# Patient Record
Sex: Female | Born: 1940 | Race: White | Hispanic: No | Marital: Married | Smoking: Never smoker
Health system: Southern US, Community
[De-identification: ages and names within clinical notes are randomized; demographics above are authoritative.]

## PROBLEM LIST (undated history)

## (undated) DIAGNOSIS — M8580 Other specified disorders of bone density and structure, unspecified site: Secondary | ICD-10-CM

## (undated) DIAGNOSIS — I10 Essential (primary) hypertension: Secondary | ICD-10-CM

## (undated) DIAGNOSIS — E785 Hyperlipidemia, unspecified: Secondary | ICD-10-CM

## (undated) HISTORY — PX: COLONOSCOPY: SHX174

---

## 2004-09-20 ENCOUNTER — Ambulatory Visit: Payer: Self-pay | Admitting: Family Medicine

## 2004-09-22 ENCOUNTER — Ambulatory Visit: Payer: Self-pay | Admitting: Family Medicine

## 2006-01-20 ENCOUNTER — Ambulatory Visit: Payer: Self-pay | Admitting: Family Medicine

## 2006-01-24 ENCOUNTER — Ambulatory Visit: Payer: Self-pay | Admitting: Family Medicine

## 2007-01-23 ENCOUNTER — Ambulatory Visit: Payer: Self-pay | Admitting: Family Medicine

## 2008-03-27 ENCOUNTER — Ambulatory Visit: Payer: Self-pay | Admitting: Family Medicine

## 2009-06-16 ENCOUNTER — Ambulatory Visit: Payer: Self-pay | Admitting: Family Medicine

## 2009-08-25 ENCOUNTER — Ambulatory Visit: Payer: Self-pay | Admitting: Gastroenterology

## 2010-06-17 ENCOUNTER — Ambulatory Visit: Payer: Self-pay | Admitting: Family Medicine

## 2011-12-01 ENCOUNTER — Ambulatory Visit: Payer: Self-pay | Admitting: Family Medicine

## 2013-07-01 ENCOUNTER — Ambulatory Visit: Payer: Self-pay | Admitting: Family Medicine

## 2014-09-11 ENCOUNTER — Ambulatory Visit: Payer: Self-pay | Admitting: Family Medicine

## 2015-09-15 ENCOUNTER — Other Ambulatory Visit: Payer: Self-pay | Admitting: Family Medicine

## 2015-09-15 DIAGNOSIS — Z1231 Encounter for screening mammogram for malignant neoplasm of breast: Secondary | ICD-10-CM

## 2015-09-29 ENCOUNTER — Ambulatory Visit
Admission: RE | Admit: 2015-09-29 | Discharge: 2015-09-29 | Disposition: A | Discharge status: Home / Self Care | Payer: Medicare Other | Source: Ambulatory Visit | Attending: Family Medicine | Admitting: Family Medicine

## 2015-09-29 ENCOUNTER — Other Ambulatory Visit: Payer: Self-pay | Admitting: Family Medicine

## 2015-09-29 DIAGNOSIS — Z1231 Encounter for screening mammogram for malignant neoplasm of breast: Secondary | ICD-10-CM

## 2016-09-26 ENCOUNTER — Other Ambulatory Visit: Payer: Self-pay | Admitting: Family Medicine

## 2016-09-26 DIAGNOSIS — Z1231 Encounter for screening mammogram for malignant neoplasm of breast: Secondary | ICD-10-CM

## 2016-10-19 ENCOUNTER — Ambulatory Visit
Admission: RE | Admit: 2016-10-19 | Discharge: 2016-10-19 | Disposition: A | Discharge status: Home / Self Care | Payer: Medicare Other | Source: Ambulatory Visit | Attending: Family Medicine | Admitting: Family Medicine

## 2016-10-19 DIAGNOSIS — Z1231 Encounter for screening mammogram for malignant neoplasm of breast: Secondary | ICD-10-CM

## 2019-01-14 ENCOUNTER — Other Ambulatory Visit: Payer: Self-pay | Admitting: Family Medicine

## 2019-01-14 DIAGNOSIS — Z1231 Encounter for screening mammogram for malignant neoplasm of breast: Secondary | ICD-10-CM

## 2019-02-21 ENCOUNTER — Other Ambulatory Visit: Payer: Self-pay

## 2019-02-21 ENCOUNTER — Ambulatory Visit
Admission: RE | Admit: 2019-02-21 | Discharge: 2019-02-21 | Disposition: A | Discharge status: Home / Self Care | Payer: Medicare Other | Source: Ambulatory Visit | Attending: Family Medicine | Admitting: Family Medicine

## 2019-02-21 DIAGNOSIS — Z1231 Encounter for screening mammogram for malignant neoplasm of breast: Secondary | ICD-10-CM

## 2020-09-22 IMAGING — MG DIGITAL SCREENING BILATERAL MAMMOGRAM WITH TOMO AND CAD
8 series · 8 of 24 positions shown · non-contrast
Comparison: Previous exam(s).

CLINICAL DATA: Screening.

EXAM:
DIGITAL SCREENING BILATERAL MAMMOGRAM WITH TOMO AND CAD

[L CC synth-2D]
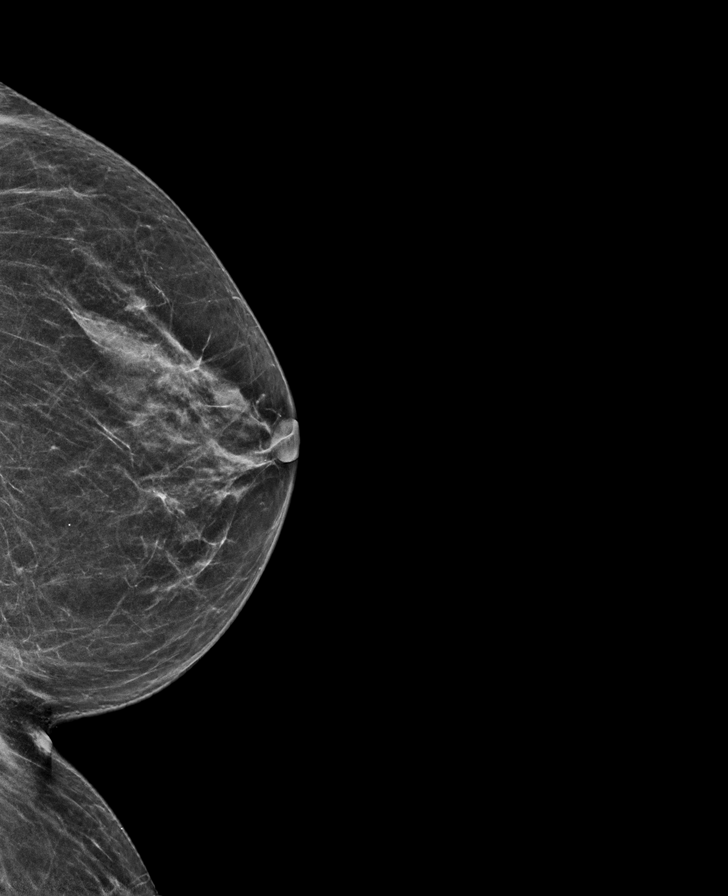

[R MLO synth-2D]
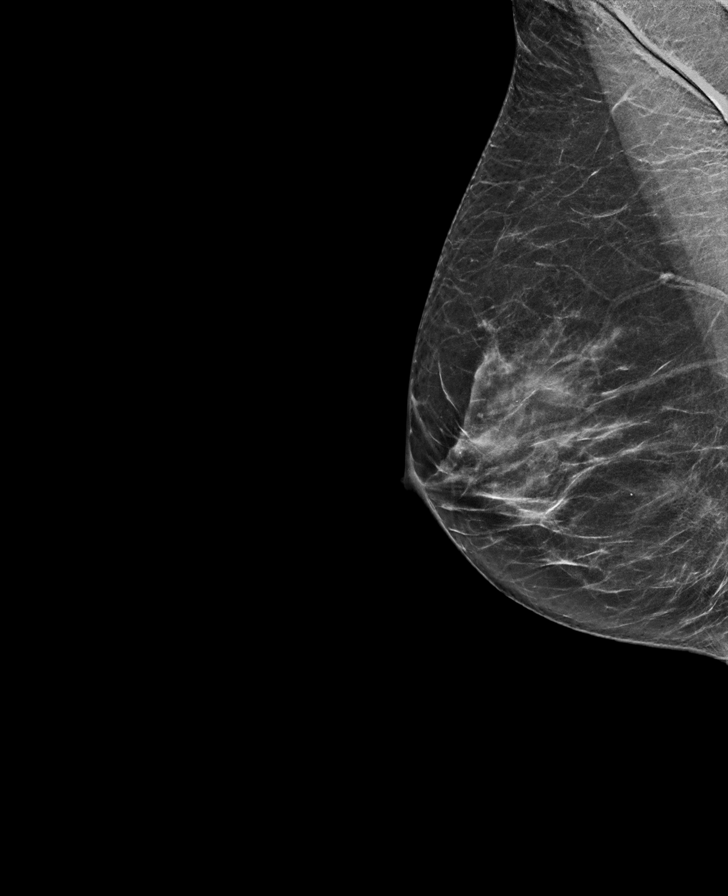

[L MLO synth-2D]
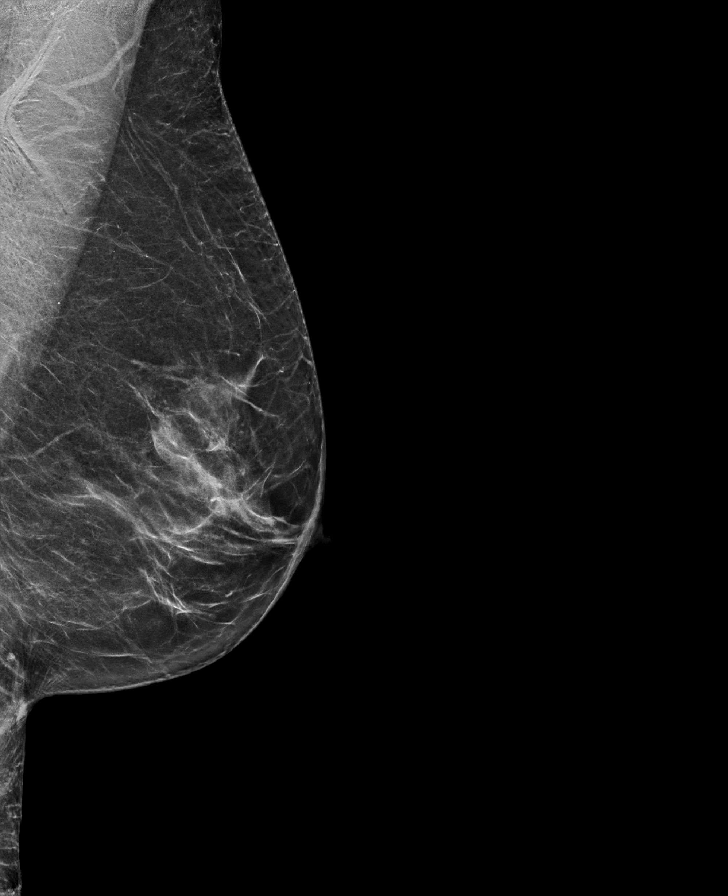

[R CC synth-2D]
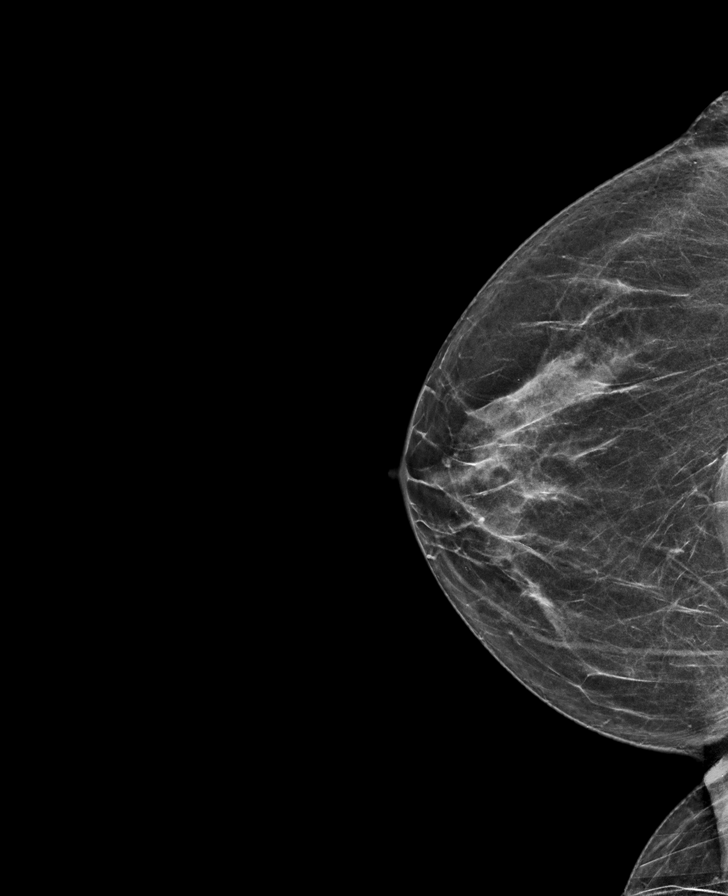

[L CC tomo · tomo slice 28/55.0]
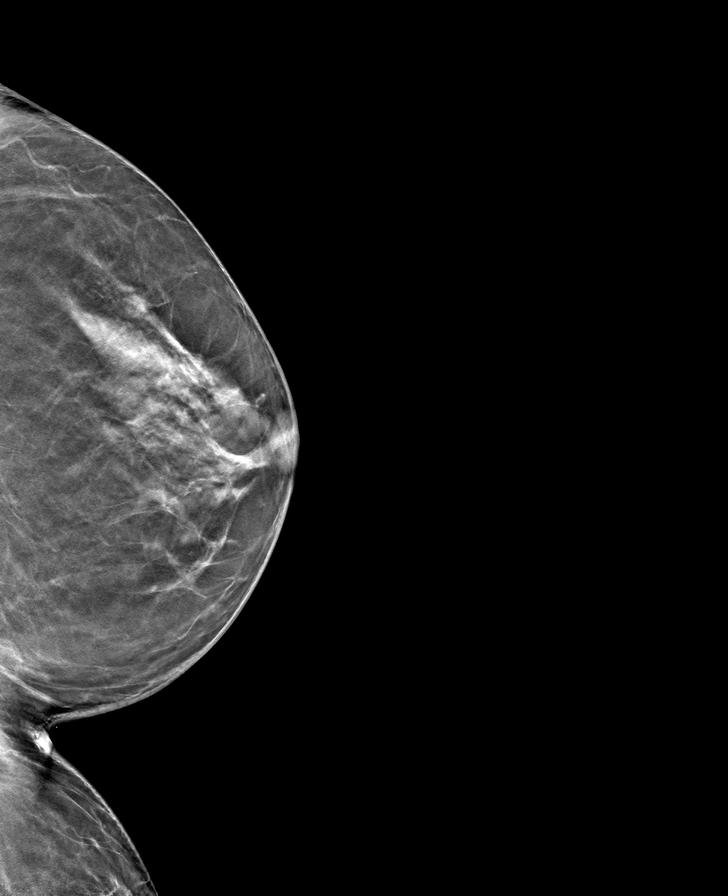

[R MLO tomo · tomo slice 32/63.0]
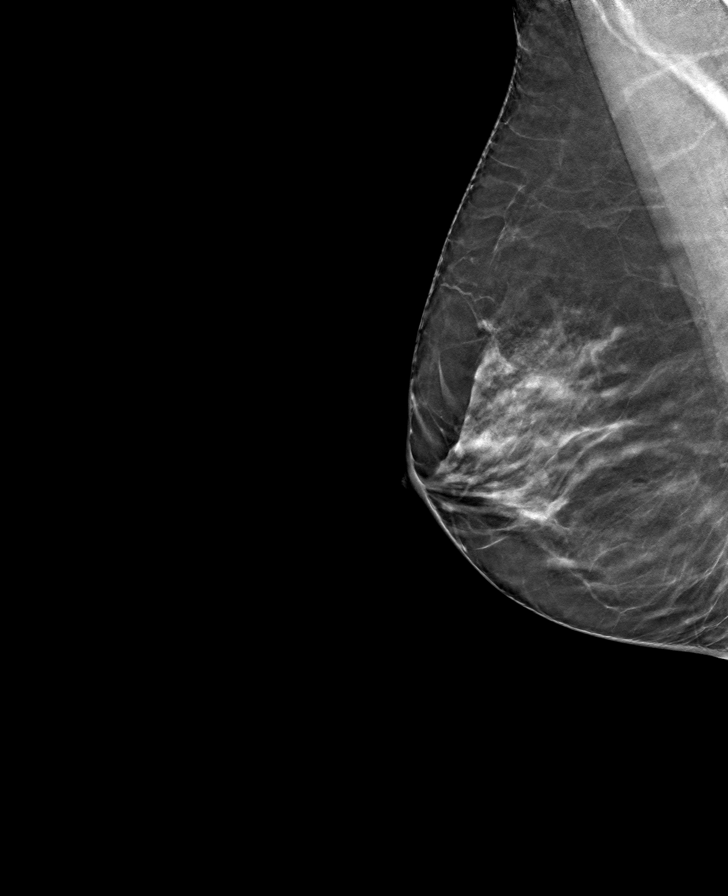

[R CC tomo · tomo slice 31/60.0]
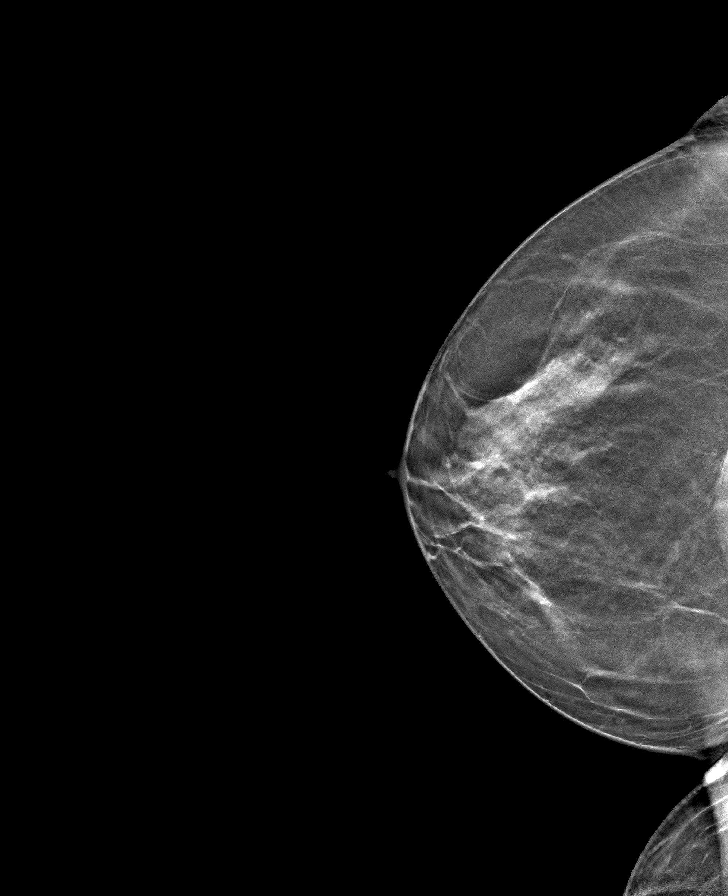

[L MLO tomo · tomo slice 33/64.0]
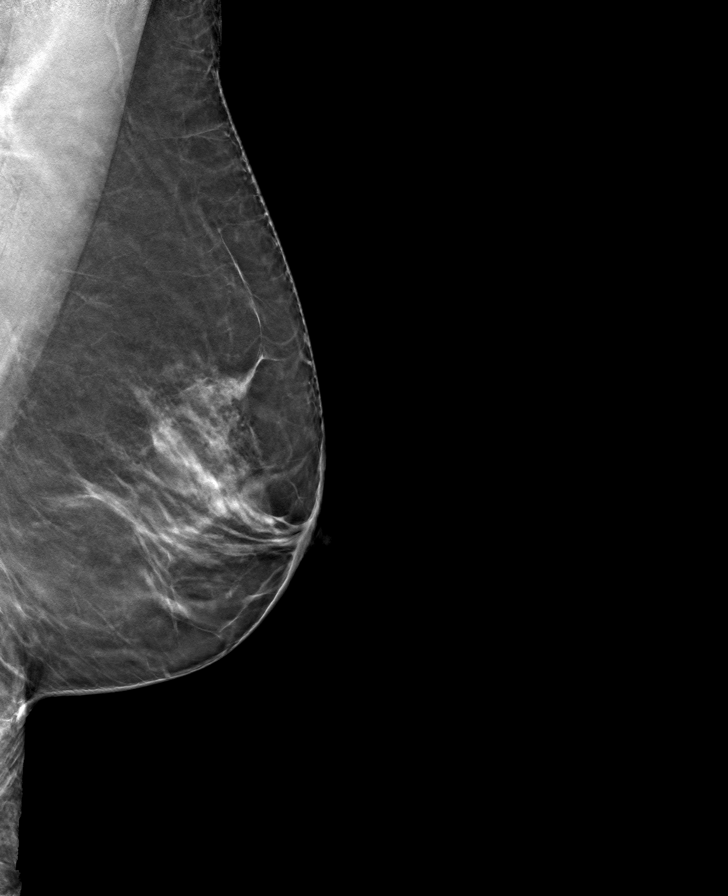

[8 of 24 positions shown; findings below may reference images not displayed]

ACR Breast Density Category c: The breast tissue is heterogeneously
dense, which may obscure small masses.
FINDINGS: There are no findings suspicious for malignancy. Images were
processed with CAD.
IMPRESSION: No mammographic evidence of malignancy. A result letter of this
screening mammogram will be mailed directly to the patient.

RECOMMENDATION:
Screening mammogram in one year. (Code:[id number])

BI-RADS CATEGORY  1: Negative.

## 2020-09-30 ENCOUNTER — Other Ambulatory Visit: Payer: Self-pay | Admitting: Family Medicine

## 2020-09-30 DIAGNOSIS — Z1231 Encounter for screening mammogram for malignant neoplasm of breast: Secondary | ICD-10-CM

## 2020-10-08 LAB — EXTERNAL GENERIC LAB PROCEDURE: COLOGUARD: POSITIVE — AB

## 2020-11-17 ENCOUNTER — Ambulatory Visit
Admission: RE | Admit: 2020-11-17 | Discharge: 2020-11-17 | Disposition: A | Discharge status: Home / Self Care | Payer: Medicare PPO | Source: Ambulatory Visit | Attending: Family Medicine | Admitting: Family Medicine

## 2020-11-17 ENCOUNTER — Other Ambulatory Visit: Payer: Self-pay

## 2020-11-17 DIAGNOSIS — Z1231 Encounter for screening mammogram for malignant neoplasm of breast: Secondary | ICD-10-CM | POA: Diagnosis present

## 2021-03-02 ENCOUNTER — Encounter: Payer: Self-pay | Admitting: General Surgery

## 2021-03-03 ENCOUNTER — Ambulatory Visit: Payer: Medicare PPO | Admitting: Anesthesiology

## 2021-03-03 ENCOUNTER — Other Ambulatory Visit: Payer: Self-pay

## 2021-03-03 ENCOUNTER — Ambulatory Visit
Admission: RE | Admit: 2021-03-03 | Discharge: 2021-03-03 | Disposition: A | Discharge status: Home / Self Care | Payer: Medicare PPO | Attending: General Surgery | Admitting: General Surgery

## 2021-03-03 ENCOUNTER — Encounter: Payer: Self-pay | Admitting: General Surgery

## 2021-03-03 ENCOUNTER — Encounter
Admission: RE | Disposition: A | Discharge status: Home / Self Care | Payer: Self-pay | Source: Home / Self Care | Attending: General Surgery

## 2021-03-03 DIAGNOSIS — Z79899 Other long term (current) drug therapy: Secondary | ICD-10-CM | POA: Diagnosis not present

## 2021-03-03 DIAGNOSIS — R195 Other fecal abnormalities: Secondary | ICD-10-CM | POA: Insufficient documentation

## 2021-03-03 DIAGNOSIS — Z882 Allergy status to sulfonamides status: Secondary | ICD-10-CM | POA: Insufficient documentation

## 2021-03-03 DIAGNOSIS — K5730 Diverticulosis of large intestine without perforation or abscess without bleeding: Secondary | ICD-10-CM | POA: Diagnosis not present

## 2021-03-03 HISTORY — DX: Other specified disorders of bone density and structure, unspecified site: M85.80

## 2021-03-03 HISTORY — PX: COLONOSCOPY WITH PROPOFOL: SHX5780

## 2021-03-03 HISTORY — DX: Hyperlipidemia, unspecified: E78.5

## 2021-03-03 HISTORY — DX: Essential (primary) hypertension: I10

## 2021-03-03 SURGERY — COLONOSCOPY WITH PROPOFOL
Anesthesia: General

## 2021-03-03 MED ORDER — EPHEDRINE SULFATE 50 MG/ML IJ SOLN
INTRAMUSCULAR | Status: DC | PRN
  Administered 2021-03-03: 5 mg via INTRAVENOUS

## 2021-03-03 MED ORDER — LIDOCAINE HCL (CARDIAC) PF 100 MG/5ML IV SOSY
PREFILLED_SYRINGE | INTRAVENOUS | Status: DC | PRN
  Administered 2021-03-03: 40 mg via INTRAVENOUS

## 2021-03-03 MED ORDER — PROPOFOL 10 MG/ML IV BOLUS
INTRAVENOUS | Status: DC | PRN
  Administered 2021-03-03 (×2): 10 mg via INTRAVENOUS
  Administered 2021-03-03: 30 mg via INTRAVENOUS

## 2021-03-03 MED ORDER — PROPOFOL 500 MG/50ML IV EMUL
INTRAVENOUS | Status: DC | PRN
  Administered 2021-03-03: 100 ug/kg/min via INTRAVENOUS

## 2021-03-03 MED ORDER — SODIUM CHLORIDE 0.9 % IV SOLN: INTRAVENOUS | Status: DC

## 2021-03-03 NOTE — Anesthesia Preprocedure Evaluation (Signed)
Anesthesia Evaluation  Patient identified by MRN, date of birth, ID band Patient awake    Reviewed: Allergy & Precautions, H&P , NPO status , Patient's Chart, lab work & pertinent test results, reviewed documented beta blocker date and time   Airway Mallampati: II   Neck ROM: full    Dental  (+) Poor Dentition   Pulmonary neg pulmonary ROS,    Pulmonary exam normal        Cardiovascular Exercise Tolerance: Poor hypertension, On Medications negative cardio ROS Normal cardiovascular exam Rhythm:regular Rate:Normal     Neuro/Psych negative neurological ROS  negative psych ROS   GI/Hepatic negative GI ROS, Neg liver ROS,   Endo/Other  negative endocrine ROS  Renal/GU negative Renal ROS  negative genitourinary   Musculoskeletal   Abdominal   Peds  Hematology negative hematology ROS (+)   Anesthesia Other Findings Past Medical History: No date: Hyperlipemia No date: Hypertension No date: Osteopenia Past Surgical History: No date: COLONOSCOPY   Reproductive/Obstetrics negative OB ROS                             Anesthesia Physical Anesthesia Plan  ASA: 3  Anesthesia Plan: General   Post-op Pain Management:    Induction:   PONV Risk Score and Plan:   Airway Management Planned:   Additional Equipment:   Intra-op Plan:   Post-operative Plan:   Informed Consent: I have reviewed the patients History and Physical, chart, labs and discussed the procedure including the risks, benefits and alternatives for the proposed anesthesia with the patient or authorized representative who has indicated his/her understanding and acceptance.     Dental Advisory Given  Plan Discussed with: CRNA  Anesthesia Plan Comments:         Anesthesia Quick Evaluation

## 2021-03-03 NOTE — Op Note (Signed)
[facility] Patient Name: Lynn Mooney Procedure Date: 03/03/2021 8:40 AM MRN: [id number] Account #: [id number] Date of Birth: January 06, 1941 Admit Type: Outpatient Age: 80 Room: [facility] ENDO ROOM 1 Gender: Female Note Status: Finalized Procedure:             Colonoscopy Indications:           Positive Cologuard test Providers:             Earline Mayotte, MD Referring MD:          Rhona Leavens. Burnett Sheng, MD (Referring MD) Medicines:             Propofol per Anesthesia Complications:         No immediate complications. Procedure:             Pre-Anesthesia Assessment:                        - Prior to the procedure, a History and Physical was                         performed, and patient medications, allergies and                         sensitivities were reviewed. The patient's tolerance                         of previous anesthesia was reviewed.                        - The risks and benefits of the procedure and the                         sedation options and risks were discussed with the                         patient. All questions were answered and informed                         consent was obtained.                        After obtaining informed consent, the colonoscope was                         passed under direct vision. Throughout the procedure,                         the patient's blood pressure, pulse, and oxygen                         saturations were monitored continuously. The                         Colonoscope was introduced through the anus and                         advanced to the the cecum, identified by appendiceal                         orifice and ileocecal valve.  The colonoscopy was                         performed without difficulty. The patient tolerated                         the procedure well. The quality of the bowel                         preparation was good. Findings:      A few medium-mouthed diverticula  were found in the sigmoid colon.      The retroflexed view of the distal rectum and anal verge was normal and       showed no anal or rectal abnormalities. Impression:            - Diverticulosis in the sigmoid colon.                        - The distal rectum and anal verge are normal on                         retroflexion view.                        - No specimens collected. Recommendation:        - Discharge patient to home (via wheelchair). Procedure Code(s):     --- Professional ---                        [phone], Colonoscopy, flexible; diagnostic, including                         collection of specimen(s) by brushing or washing, when                         performed (separate procedure) Diagnosis Code(s):     --- Professional ---                        R19.5, Other fecal abnormalities                        K57.30, Diverticulosis of large intestine without                         perforation or abscess without bleeding CPT copyright 2019 [employer]. All rights reserved. The codes documented in this report are preliminary and upon coder review may  be revised to meet current compliance requirements. Earline Mayotte, MD 03/03/2021 9:15:48 AM This report has been signed electronically. Number of Addenda: 0 Note Initiated On: 03/03/2021 8:40 AM Scope Withdrawal Time: 0 hours 14 minutes 43 seconds  Total Procedure Duration: 0 hours 24 minutes 9 seconds       [facility]

## 2021-03-03 NOTE — H&P (Signed)
Lynn Mooney [id number] [birth year]     HPI: Healthy 80 y/o woman with a normal colonoscopy in 2011.  Recent Cologuard test was positive. For colonoscopy. She tolerated the prep well.   Medications Prior to Admission  Medication Sig Dispense Refill Last Dose   lisinopril (ZESTRIL) 20 MG tablet Take 20 mg by mouth daily.   03/02/2021 at 1800   metoprolol succinate (TOPROL-XL) 50 MG 24 hr tablet Take 50 mg by mouth daily. Take with or immediately following a meal.   03/02/2021 at 1800   simvastatin (ZOCOR) 10 MG tablet Take 10 mg by mouth daily.   03/02/2021 at 1800   ciprofloxacin (CIPRO) 250 MG tablet Take 250 mg by mouth 2 (two) times daily. (Patient not taking: Reported on 03/03/2021)   Completed Course   Allergies  Allergen Reactions   Sulfa Antibiotics    Past Medical History:  Diagnosis Date   Hyperlipemia    Hypertension    Osteopenia    Past Surgical History:  Procedure Laterality Date   COLONOSCOPY     Social History   Socioeconomic History   Marital status: Married    Spouse name: Not on file   Number of children: Not on file   Years of education: Not on file   Highest education level: Not on file  Occupational History   Not on file  Tobacco Use   Smoking status: Never   Smokeless tobacco: Never  Vaping Use   Vaping Use: Never used  Substance and Sexual Activity   Alcohol use: Never   Drug use: Never   Sexual activity: Not on file  Other Topics Concern   Not on file  Social History Narrative   Not on file   Social Determinants of Health   Financial Resource Strain: Not on file  Food Insecurity: Not on file  Transportation Needs: Not on file  Physical Activity: Not on file  Stress: Not on file  Social Connections: Not on file  Intimate Partner Violence: Not on file   Social History   Social History Narrative   Not on file     ROS: Negative.     PE: HEENT: Negative. Lungs: Clear. Cardio: RR.   Assessment/Plan:  Proceed with planned  endoscopy.    Merrily Pew Rockie Vawter 03/03/2021

## 2021-03-03 NOTE — Transfer of Care (Signed)
Immediate Anesthesia Transfer of Care Note  Patient: SHARESA KEMP  Procedure(s) Performed: COLONOSCOPY WITH PROPOFOL  Patient Location: PACU and Endoscopy Unit  Anesthesia Type:General  Level of Consciousness: awake  Airway & Oxygen Therapy: Patient Spontanous Breathing  Post-op Assessment: Report given to RN and Post -op Vital signs reviewed and stable  Post vital signs: Reviewed and stable  Last Vitals:  Vitals Value Taken Time  BP 137/91 03/03/21 0918  Temp    Pulse 68 03/03/21 0918  Resp 13 03/03/21 0918  SpO2 100 % 03/03/21 0918  Vitals shown include unvalidated device data.  Last Pain:  Vitals:   03/03/21 0818  TempSrc: Temporal  PainSc: 0-No pain         Complications: No notable events documented.

## 2021-03-04 ENCOUNTER — Encounter: Payer: Self-pay | Admitting: General Surgery

## 2021-03-09 NOTE — Anesthesia Postprocedure Evaluation (Signed)
Anesthesia Post Note  Patient: Lynn Mooney  Procedure(s) Performed: COLONOSCOPY WITH PROPOFOL  Patient location during evaluation: PACU Anesthesia Type: General Level of consciousness: awake and alert Pain management: pain level controlled Vital Signs Assessment: post-procedure vital signs reviewed and stable Respiratory status: spontaneous breathing, nonlabored ventilation, respiratory function stable and patient connected to nasal cannula oxygen Cardiovascular status: blood pressure returned to baseline and stable Postop Assessment: no apparent nausea or vomiting Anesthetic complications: no   No notable events documented.   Last Vitals:  Vitals:   03/03/21 0930 03/03/21 0940  BP: (!) 132/99 (!) 162/66  Pulse: 73 72  Resp: 16 14  Temp:    SpO2: 100% 97%    Last Pain:  Vitals:   03/03/21 0910  TempSrc: Temporal  PainSc:                  Yevette Edwards

## 2023-06-07 ENCOUNTER — Other Ambulatory Visit: Payer: Self-pay | Admitting: Family Medicine

## 2023-06-07 DIAGNOSIS — Z1231 Encounter for screening mammogram for malignant neoplasm of breast: Secondary | ICD-10-CM

## 2023-06-27 ENCOUNTER — Ambulatory Visit
Admission: RE | Admit: 2023-06-27 | Discharge: 2023-06-27 | Disposition: A | Discharge status: Home / Self Care | Payer: Medicare PPO | Source: Ambulatory Visit | Attending: Family Medicine | Admitting: Family Medicine

## 2023-06-27 DIAGNOSIS — Z1231 Encounter for screening mammogram for malignant neoplasm of breast: Secondary | ICD-10-CM | POA: Insufficient documentation
